# Patient Record
Sex: Female | Born: 1990 | Race: White | Hispanic: No | Marital: Single | Smoking: Current every day smoker
Health system: Southern US, Community
[De-identification: ages and names within clinical notes are randomized; demographics above are authoritative.]

## PROBLEM LIST (undated history)

## (undated) DIAGNOSIS — S32009A Unspecified fracture of unspecified lumbar vertebra, initial encounter for closed fracture: Secondary | ICD-10-CM

## (undated) DIAGNOSIS — F1190 Opioid use, unspecified, uncomplicated: Secondary | ICD-10-CM

## (undated) HISTORY — PX: CHOLECYSTECTOMY: SHX55

---
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
## 2015-05-08 ENCOUNTER — Encounter (HOSPITAL_COMMUNITY): Payer: Self-pay | Admitting: Emergency Medicine

## 2015-05-08 ENCOUNTER — Emergency Department (HOSPITAL_COMMUNITY)
Admission: EM | Admit: 2015-05-08 | Discharge: 2015-05-08 | Disposition: A | Discharge status: Home / Self Care | Payer: Self-pay

## 2015-05-08 ENCOUNTER — Emergency Department (HOSPITAL_COMMUNITY): Payer: No Typology Code available for payment source

## 2015-05-08 ENCOUNTER — Emergency Department (HOSPITAL_COMMUNITY)
Admission: EM | Admit: 2015-05-08 | Discharge: 2015-05-09 | Disposition: A | Discharge status: Home / Self Care | Payer: No Typology Code available for payment source | Attending: Emergency Medicine | Admitting: Emergency Medicine

## 2015-05-08 DIAGNOSIS — Z79899 Other long term (current) drug therapy: Secondary | ICD-10-CM | POA: Diagnosis not present

## 2015-05-08 DIAGNOSIS — R1084 Generalized abdominal pain: Secondary | ICD-10-CM

## 2015-05-08 DIAGNOSIS — K2970 Gastritis, unspecified, without bleeding: Secondary | ICD-10-CM | POA: Insufficient documentation

## 2015-05-08 DIAGNOSIS — Z3202 Encounter for pregnancy test, result negative: Secondary | ICD-10-CM | POA: Insufficient documentation

## 2015-05-08 DIAGNOSIS — Z8781 Personal history of (healed) traumatic fracture: Secondary | ICD-10-CM | POA: Diagnosis not present

## 2015-05-08 DIAGNOSIS — Z9049 Acquired absence of other specified parts of digestive tract: Secondary | ICD-10-CM | POA: Diagnosis not present

## 2015-05-08 DIAGNOSIS — R109 Unspecified abdominal pain: Secondary | ICD-10-CM | POA: Diagnosis present

## 2015-05-08 DIAGNOSIS — F17200 Nicotine dependence, unspecified, uncomplicated: Secondary | ICD-10-CM | POA: Insufficient documentation

## 2015-05-08 DIAGNOSIS — K5900 Constipation, unspecified: Secondary | ICD-10-CM | POA: Insufficient documentation

## 2015-05-08 HISTORY — DX: Unspecified fracture of unspecified lumbar vertebra, initial encounter for closed fracture: S32.009A

## 2015-05-08 LAB — COMPREHENSIVE METABOLIC PANEL
ALT: 15 U/L (ref 14–54)
AST: 15 U/L (ref 15–41)
Albumin: 4.2 g/dL (ref 3.5–5.0)
Alkaline Phosphatase: 83 U/L (ref 38–126)
Anion gap: 6 (ref 5–15)
BILIRUBIN TOTAL: 0.4 mg/dL (ref 0.3–1.2)
BUN: 18 mg/dL (ref 6–20)
CHLORIDE: 106 mmol/L (ref 101–111)
CO2: 27 mmol/L (ref 22–32)
CREATININE: 0.73 mg/dL (ref 0.44–1.00)
Calcium: 9.3 mg/dL (ref 8.9–10.3)
Glucose, Bld: 102 mg/dL — ABNORMAL HIGH (ref 65–99)
POTASSIUM: 4.1 mmol/L (ref 3.5–5.1)
Sodium: 139 mmol/L (ref 135–145)
TOTAL PROTEIN: 7.3 g/dL (ref 6.5–8.1)

## 2015-05-08 LAB — POC URINE PREG, ED: PREG TEST UR: NEGATIVE

## 2015-05-08 LAB — CBC
HEMATOCRIT: 36 % (ref 36.0–46.0)
Hemoglobin: 12.5 g/dL (ref 12.0–15.0)
MCH: 31.9 pg (ref 26.0–34.0)
MCHC: 34.7 g/dL (ref 30.0–36.0)
MCV: 91.8 fL (ref 78.0–100.0)
PLATELETS: 290 10*3/uL (ref 150–400)
RBC: 3.92 MIL/uL (ref 3.87–5.11)
RDW: 12.7 % (ref 11.5–15.5)
WBC: 10.8 10*3/uL — AB (ref 4.0–10.5)

## 2015-05-08 LAB — LIPASE, BLOOD: LIPASE: 37 U/L (ref 11–51)

## 2015-05-08 MED ORDER — DIPHENHYDRAMINE HCL 50 MG/ML IJ SOLN
25.0000 mg | Freq: Once | INTRAMUSCULAR | Status: AC
  Administered 2015-05-09: 25 mg via INTRAVENOUS
  Filled 2015-05-08: qty 1

## 2015-05-08 MED ORDER — HYDROMORPHONE HCL 1 MG/ML IJ SOLN
1.0000 mg | Freq: Once | INTRAMUSCULAR | Status: AC
Start: 2015-05-08 — End: 2015-05-09
  Administered 2015-05-09: 1 mg via INTRAVENOUS
  Filled 2015-05-08: qty 1

## 2015-05-08 MED ORDER — ONDANSETRON HCL 4 MG/2ML IJ SOLN
4.0000 mg | Freq: Once | INTRAMUSCULAR | Status: AC
  Administered 2015-05-09: 4 mg via INTRAVENOUS
  Filled 2015-05-08: qty 2

## 2015-05-08 MED ORDER — SODIUM CHLORIDE 0.9 % IV BOLUS (SEPSIS)
1000.0000 mL | Freq: Once | INTRAVENOUS | Status: AC
  Administered 2015-05-09: 1000 mL via INTRAVENOUS

## 2015-05-08 MED ORDER — METOCLOPRAMIDE HCL 5 MG/ML IJ SOLN
10.0000 mg | Freq: Once | INTRAMUSCULAR | Status: AC
  Administered 2015-05-09: 10 mg via INTRAVENOUS
  Filled 2015-05-08: qty 2

## 2015-05-08 NOTE — ED Notes (Signed)
Pt is c/o abd pain and lower back pain  Pt states she was seen by her dr today and was given phenergan and a script for zofran but she says she is still vomiting  Pt states the pain initially started for a month and a half but for the past week and a half she has had nausea and vomiting

## 2015-05-08 NOTE — ED Provider Notes (Signed)
CSN: [id number]     Arrival date & time 05/08/15  2137 History  By signing my name below, I, Anne Graham, attest that this documentation has been prepared under the direction and in the presence of Anne Creasehristopher J Aryan Bello, MD. Electronically Signed: Bethel BornBritney Graham, ED Scribe. 05/09/2015. 1:24 AM   Chief Complaint  Patient presents with  . Abdominal Pain   The history is provided by the patient. No language interpreter was used.   Anne Graham is a 25 y.o. female who presents to the Emergency Department complaining of intermittent, 10/10 in severity, right-sided abdominal pain with onset more than 1 month ago. Associated symptoms include nausea and vomiting since yesterday. She states that last week she had n/v/d but today feels constipated. She was seen by her PCP today where she was given a phenergan injection and sent home with a prescription for Zofran. She filled the prescription but did not take the medication. Pt is scheduled for an ultrasound next week but states that she cannot wait that long. Past surgical history includes cholecystectomy 1 year ago.   Past Medical History  Diagnosis Date  . Lumbar vertebral fracture [facility](HCC)    Past Surgical History  Procedure Laterality Date  . Cholecystectomy     History reviewed. No pertinent family history. Social History  Substance Use Topics  . Smoking status: Current Every Day Smoker  . Smokeless tobacco: None  . Alcohol Use: No   OB History    No data available     Review of Systems  Gastrointestinal: Positive for nausea, vomiting, abdominal pain and constipation. Negative for diarrhea.  All other systems reviewed and are negative.  Allergies  Trazodone and nefazodone  Home Medications   Prior to Admission medications   Medication Sig Start Date End Date Taking? Authorizing Provider  gabapentin (NEURONTIN) 300 MG capsule Take 300 mg by mouth 2 (two) times daily. 04/16/15  Yes Historical Provider, MD  omeprazole (PRILOSEC)  20 MG capsule Take 1 capsule (20 mg total) by mouth daily. 05/09/15   Anne Creasehristopher J Anne Schwertner, MD  ranitidine (ZANTAC) 150 MG tablet Take 1 tablet (150 mg total) by mouth 2 (two) times daily. 05/09/15   Anne Creasehristopher J Anne Van, MD  sucralfate (CARAFATE) 1 GM/10ML suspension Take 10 mLs (1 g total) by mouth 4 (four) times daily -  with meals and at bedtime. 05/09/15   Anne Creasehristopher J Anne Scripter, MD  traMADol (ULTRAM) 50 MG tablet Take 1 tablet (50 mg total) by mouth every 6 (six) hours as needed. 05/09/15   Anne Creasehristopher J Anne Karren, MD   BP 140/90 mmHg  Pulse 80  Temp(Src) 97.6 F (36.4 C) (Oral)  Resp 12  Ht 5\' 4"  (1.626 m)  Wt 123 lb (55.792 kg)  BMI 21.10 kg/m2  SpO2 100%  LMP 04/28/2015 (Approximate) Physical Exam  Constitutional: She is oriented to person, place, and time. She appears well-developed and well-nourished. No distress.  HENT:  Head: Normocephalic and atraumatic.  Right Ear: Hearing normal.  Left Ear: Hearing normal.  Nose: Nose normal.  Mouth/Throat: Oropharynx is clear and moist and mucous membranes are normal.  Eyes: Conjunctivae and EOM are normal. Pupils are equal, round, and reactive to light.  Neck: Normal range of motion. Neck supple.  Cardiovascular: Regular rhythm, S1 normal and S2 normal.  Exam reveals no gallop and no friction rub.   No murmur heard. Pulmonary/Chest: Effort normal and breath sounds normal. No respiratory distress. She exhibits no tenderness.  Abdominal: Soft. Normal appearance and bowel sounds are normal.  There is no hepatosplenomegaly. There is tenderness in the right upper quadrant, right lower quadrant and epigastric area. There is no rebound, no guarding, no tenderness at McBurney's point and negative Murphy's sign. No hernia.  Musculoskeletal: Normal range of motion.  Neurological: She is alert and oriented to person, place, and time. She has normal strength. No cranial nerve deficit or sensory deficit. Coordination normal. GCS eye subscore is 4. GCS verbal  subscore is 5. GCS motor subscore is 6.  Skin: Skin is warm, dry and intact. No rash noted. No cyanosis.  Psychiatric: She has a normal mood and affect. Her speech is normal and behavior is normal. Thought content normal.  Nursing note and vitals reviewed.   ED Course  Procedures (including critical care time) DIAGNOSTIC STUDIES: Oxygen Saturation is 100% on RA,  normal by my interpretation.    COORDINATION OF CARE: 11:11 PM Discussed treatment plan which includes lab work, Zofran, Reglan, Benadryl, and Dilaudid with pt at bedside and pt agreed to plan.  Labs Review Labs Reviewed  COMPREHENSIVE METABOLIC PANEL - Abnormal; Notable for the following:    Glucose, Bld 102 (*)    All other components within normal limits  CBC - Abnormal; Notable for the following:    WBC 10.8 (*)    All other components within normal limits  URINALYSIS, ROUTINE W REFLEX MICROSCOPIC (NOT AT [facility]) - Abnormal; Notable for the following:    APPearance CLOUDY (*)    All other components within normal limits  LIPASE, BLOOD  RPR  HIV ANTIBODY (ROUTINE TESTING)  POC URINE PREG, ED  I-STAT BETA HCG BLOOD, ED ([facility], [facility], [facility] ONLY)  GC/CHLAMYDIA PROBE AMP ([place]) NOT AT [facility]    Imaging Review No results found. I personally reviewed and evaluated these images and lab results as a part of my medical decision-making.   EKG Interpretation None      MDM   Final diagnoses:  Gastritis  Generalized abdominal pain   Patient presents to the emergency department with complaints of abdominal pain. She reports the symptoms have been ongoing for more than a month. She saw her doctor and was given symptomatic treatment, but it has not helped. Pain is diffuse. Examination reveals tenderness in the right lower, right upper, epigastric regions. There is no guarding, rebound or signs of peritonitis. Lab work was unremarkable. CT scan shows evidence of gastritis, no other acute findings. Patient will require aggressive  treatment for gastritis/GERD, referral to gastroenterology for further evaluation for possible peptic ulcer disease.  I personally performed the services described in this documentation, which was scribed in my presence. The recorded information has been reviewed and is accurate.    Anne Crease, MD 05/12/15 [phone]

## 2015-05-09 LAB — URINALYSIS, ROUTINE W REFLEX MICROSCOPIC
Bilirubin Urine: NEGATIVE
Glucose, UA: NEGATIVE mg/dL
Hgb urine dipstick: NEGATIVE
KETONES UR: NEGATIVE mg/dL
LEUKOCYTES UA: NEGATIVE
NITRITE: NEGATIVE
PROTEIN: NEGATIVE mg/dL
Specific Gravity, Urine: 1.026 (ref 1.005–1.030)
pH: 7.5 (ref 5.0–8.0)

## 2015-05-09 LAB — I-STAT BETA HCG BLOOD, ED (MC, WL, AP ONLY)

## 2015-05-09 MED ORDER — IOPAMIDOL (ISOVUE-300) INJECTION 61%
100.0000 mL | Freq: Once | INTRAVENOUS | Status: AC | PRN
  Administered 2015-05-09: 100 mL via INTRAVENOUS

## 2015-05-09 MED ORDER — OMEPRAZOLE 20 MG PO CPDR: 20.0000 mg | DELAYED_RELEASE_CAPSULE | Freq: Every day | ORAL | Status: AC

## 2015-05-09 MED ORDER — RANITIDINE HCL 150 MG PO TABS: 150.0000 mg | ORAL_TABLET | Freq: Two times a day (BID) | ORAL | Status: AC

## 2015-05-09 MED ORDER — TRAMADOL HCL 50 MG PO TABS: 50.0000 mg | ORAL_TABLET | Freq: Four times a day (QID) | ORAL | Status: AC | PRN

## 2015-05-09 MED ORDER — GI COCKTAIL ~~LOC~~
30.0000 mL | Freq: Once | ORAL | Status: AC
  Administered 2015-05-09: 30 mL via ORAL
  Filled 2015-05-09: qty 30

## 2015-05-09 MED ORDER — SUCRALFATE 1 GM/10ML PO SUSP: 1.0000 g | Freq: Three times a day (TID) | ORAL | Status: AC

## 2015-05-09 NOTE — Discharge Instructions (Signed)

## 2015-05-09 NOTE — ED Notes (Signed)
Pt reports understanding of discharge information. No questions at time of discharge 

## 2015-05-10 LAB — HIV ANTIBODY (ROUTINE TESTING W REFLEX): HIV SCREEN 4TH GENERATION: NONREACTIVE

## 2015-05-10 LAB — RPR: RPR: NONREACTIVE

## 2017-07-27 DIAGNOSIS — J189 Pneumonia, unspecified organism: Secondary | ICD-10-CM | POA: Diagnosis not present

## 2017-07-28 DIAGNOSIS — J189 Pneumonia, unspecified organism: Secondary | ICD-10-CM | POA: Diagnosis not present

## 2017-09-17 ENCOUNTER — Other Ambulatory Visit: Payer: Self-pay

## 2017-09-17 ENCOUNTER — Encounter (HOSPITAL_COMMUNITY): Payer: Self-pay | Admitting: Family Medicine

## 2017-09-17 ENCOUNTER — Inpatient Hospital Stay (HOSPITAL_COMMUNITY)
Admission: AD | Admit: 2017-09-17 | Discharge: 2017-09-17 | DRG: 202 | Discharge status: Against Medical Advice | Payer: Medicaid Other | Source: Other Acute Inpatient Hospital | Attending: Internal Medicine | Admitting: Internal Medicine

## 2017-09-17 ENCOUNTER — Inpatient Hospital Stay (HOSPITAL_COMMUNITY): Payer: Medicaid Other

## 2017-09-17 DIAGNOSIS — J42 Unspecified chronic bronchitis: Secondary | ICD-10-CM | POA: Diagnosis present

## 2017-09-17 DIAGNOSIS — Z833 Family history of diabetes mellitus: Secondary | ICD-10-CM | POA: Diagnosis not present

## 2017-09-17 DIAGNOSIS — Z885 Allergy status to narcotic agent status: Secondary | ICD-10-CM | POA: Diagnosis not present

## 2017-09-17 DIAGNOSIS — F17210 Nicotine dependence, cigarettes, uncomplicated: Secondary | ICD-10-CM | POA: Diagnosis present

## 2017-09-17 DIAGNOSIS — E871 Hypo-osmolality and hyponatremia: Secondary | ICD-10-CM | POA: Diagnosis present

## 2017-09-17 DIAGNOSIS — R05 Cough: Secondary | ICD-10-CM

## 2017-09-17 DIAGNOSIS — J209 Acute bronchitis, unspecified: Principal | ICD-10-CM | POA: Diagnosis present

## 2017-09-17 DIAGNOSIS — J9601 Acute respiratory failure with hypoxia: Secondary | ICD-10-CM | POA: Diagnosis present

## 2017-09-17 DIAGNOSIS — R053 Chronic cough: Secondary | ICD-10-CM

## 2017-09-17 DIAGNOSIS — Z9049 Acquired absence of other specified parts of digestive tract: Secondary | ICD-10-CM | POA: Diagnosis not present

## 2017-09-17 DIAGNOSIS — R0602 Shortness of breath: Secondary | ICD-10-CM | POA: Diagnosis present

## 2017-09-17 LAB — CBC WITH DIFFERENTIAL/PLATELET
Abs Immature Granulocytes: 0 10*3/uL (ref 0.0–0.1)
Basophils Absolute: 0 10*3/uL (ref 0.0–0.1)
Basophils Relative: 0 %
EOS ABS: 0 10*3/uL (ref 0.0–0.7)
Eosinophils Relative: 0 %
HEMATOCRIT: 37.3 % (ref 36.0–46.0)
Hemoglobin: 12.1 g/dL (ref 12.0–15.0)
IMMATURE GRANULOCYTES: 0 %
LYMPHS ABS: 0.6 10*3/uL — AB (ref 0.7–4.0)
Lymphocytes Relative: 5 %
MCH: 29.4 pg (ref 26.0–34.0)
MCHC: 32.4 g/dL (ref 30.0–36.0)
MCV: 90.5 fL (ref 78.0–100.0)
MONO ABS: 0.1 10*3/uL (ref 0.1–1.0)
MONOS PCT: 1 %
NEUTROS PCT: 94 %
Neutro Abs: 11 10*3/uL — ABNORMAL HIGH (ref 1.7–7.7)
Platelets: 355 10*3/uL (ref 150–400)
RBC: 4.12 MIL/uL (ref 3.87–5.11)
RDW: 14.7 % (ref 11.5–15.5)
WBC: 11.9 10*3/uL — ABNORMAL HIGH (ref 4.0–10.5)

## 2017-09-17 LAB — COMPREHENSIVE METABOLIC PANEL
ALBUMIN: 3.4 g/dL — AB (ref 3.5–5.0)
ALK PHOS: 92 U/L (ref 38–126)
ALT: 15 U/L (ref 0–44)
AST: 19 U/L (ref 15–41)
Anion gap: 10 (ref 5–15)
BUN: 7 mg/dL (ref 6–20)
CALCIUM: 9.7 mg/dL (ref 8.9–10.3)
CHLORIDE: 102 mmol/L (ref 98–111)
CO2: 27 mmol/L (ref 22–32)
CREATININE: 0.65 mg/dL (ref 0.44–1.00)
GFR calc non Af Amer: >60 mL/min (ref >60)
GLUCOSE: 176 mg/dL — AB (ref 70–99)
Potassium: 3.8 mmol/L (ref 3.5–5.1)
SODIUM: 139 mmol/L (ref 135–145)
Total Bilirubin: 1.1 mg/dL (ref 0.3–1.2)
Total Protein: 7.1 g/dL (ref 6.5–8.1)

## 2017-09-17 LAB — HIV ANTIBODY (ROUTINE TESTING W REFLEX): HIV SCREEN 4TH GENERATION: NONREACTIVE

## 2017-09-17 LAB — PROCALCITONIN: Procalcitonin: <0.1 ng/mL

## 2017-09-17 MED ORDER — ALBUTEROL SULFATE (2.5 MG/3ML) 0.083% IN NEBU
2.5000 mg | INHALATION_SOLUTION | RESPIRATORY_TRACT | Status: DC | PRN
Start: 2017-09-17 — End: 2017-09-17

## 2017-09-17 MED ORDER — TRAMADOL HCL 50 MG PO TABS: 50.0000 mg | ORAL_TABLET | Freq: Four times a day (QID) | ORAL | Status: DC | PRN

## 2017-09-17 MED ORDER — SODIUM CHLORIDE 0.9 % IV SOLN: 250.0000 mL | INTRAVENOUS | Status: DC | PRN

## 2017-09-17 MED ORDER — ONDANSETRON HCL 4 MG PO TABS: 4.0000 mg | ORAL_TABLET | Freq: Four times a day (QID) | ORAL | Status: DC | PRN

## 2017-09-17 MED ORDER — SODIUM CHLORIDE 0.9% FLUSH: 3.0000 mL | INTRAVENOUS | Status: DC | PRN

## 2017-09-17 MED ORDER — PANTOPRAZOLE SODIUM 40 MG PO TBEC
40.0000 mg | DELAYED_RELEASE_TABLET | Freq: Every day | ORAL | Status: DC
  Administered 2017-09-17: 40 mg via ORAL
  Filled 2017-09-17: qty 1

## 2017-09-17 MED ORDER — ACETAMINOPHEN 325 MG PO TABS: 650.0000 mg | ORAL_TABLET | Freq: Four times a day (QID) | ORAL | Status: DC | PRN

## 2017-09-17 MED ORDER — SODIUM CHLORIDE 0.9% FLUSH
3.0000 mL | Freq: Two times a day (BID) | INTRAVENOUS | Status: DC
  Administered 2017-09-17: 3 mL via INTRAVENOUS

## 2017-09-17 MED ORDER — SENNOSIDES-DOCUSATE SODIUM 8.6-50 MG PO TABS: 1.0000 | ORAL_TABLET | Freq: Every evening | ORAL | Status: DC | PRN

## 2017-09-17 MED ORDER — ACETAMINOPHEN 650 MG RE SUPP: 650.0000 mg | Freq: Four times a day (QID) | RECTAL | Status: DC | PRN

## 2017-09-17 MED ORDER — HYDROCODONE-ACETAMINOPHEN 5-325 MG PO TABS: 1.0000 | ORAL_TABLET | ORAL | Status: DC | PRN

## 2017-09-17 MED ORDER — ONDANSETRON HCL 4 MG/2ML IJ SOLN: 4.0000 mg | Freq: Four times a day (QID) | INTRAMUSCULAR | Status: DC | PRN

## 2017-09-17 NOTE — Discharge Summary (Signed)
Physician Discharge Summary  Patient ID: Anne Graham MRN: [id number] DOB/AGE: 06/04/1990 27 y.o.  Admit date: 09/17/2017 Discharge date: 09/17/2017  Admission Diagnoses:  Discharge Diagnoses:  Principal Problem:   Acute bronchitis with bronchospasm Active Problems:   Acute respiratory failure with hypoxia [facility](HCC)   Chronic cough   Hospital Course: Patient was admitted earlier today.  Kindly refer to the H&P done earlier by Dr. Marcial Pacasimothy Graham.  Patient signed himself out of the hospital AGAINST MEDICAL ADVICE (same day of admission).  Allergies as of 09/17/2017      Reactions   Toradol [ketorolac Tromethamine]    Difficulty swallowing and feels metal test in her mouth for long times.      Medication List    ASK your doctor about these medications   albuterol (2.5 MG/3ML) 0.083% nebulizer solution Commonly known as:  PROVENTIL Take 2.5 mg by nebulization every 6 (six) hours as needed for wheezing or shortness of breath.   COMBIVENT RESPIMAT 20-100 MCG/ACT Aers respimat Generic drug:  Ipratropium-Albuterol Inhale 1 puff into the lungs every 6 (six) hours.   omeprazole 20 MG capsule Commonly known as:  PRILOSEC Take 1 capsule (20 mg total) by mouth daily.   ranitidine 150 MG tablet Commonly known as:  ZANTAC Take 1 tablet (150 mg total) by mouth 2 (two) times daily.   sucralfate 1 GM/10ML suspension Commonly known as:  CARAFATE Take 10 mLs (1 g total) by mouth 4 (four) times daily -  with meals and at bedtime.   traMADol 50 MG tablet Commonly known as:  ULTRAM Take 1 tablet (50 mg total) by mouth every 6 (six) hours as needed.        SignedBarnetta Graham: Anne Graham I Anne Graham 09/17/2017, 11:12 PM

## 2017-09-17 NOTE — Progress Notes (Signed)
Patient very agitated w/mother at bedside, requests immediate D/C. MD notified via txt page and called this RN to speak with patient via telephone. Patient to leave AMA at this time.

## 2017-09-17 NOTE — Consult Note (Signed)
Anne Graham  HYW:[id number] DOB: 01-28-1991 DOA: 09/17/2017 PCP: System, Pcp Not In    Reason for Consult/Chief Complaint:  Recurrent pneumonia  Consulting MD:  Marthenia Rolling   HPI/Brief Narrative   27 year old female patient with a history of smoking, initially admitted at [facility] via referral from [facility] back in June/July for recurrent multifocal pulmonary infiltrates.  Review of medical records show last hospital discharge on 7/4 2019 when the patient left AGAINST MEDICAL ADVICE.  During that admission she had been admitted following approximately 3 weeks of ongoing cough and shortness of breath with failure to respond to outpatient antibiotics including doxycycline, Zithromax, and Cefdinir.  CT of chest on that admission showed multifocal bilateral pulmonary infiltrates.  She was started on IV antibiotics, with plans to undergo further diagnostics such as autoimmune evaluation, checking immunocompromised status and ruling out opportunistic infections as well as possible bronchoscopy.  The patient stayed in the hospital only 1 day and discharged herself reporting she wanted to be home with her family for 4 July. She presented once again to the emergency room on 8/15.  At time of discharge she still had cough and shortness of breath.  The symptoms had not completely resolved and she had returned to the emergency room at [facility] on at least 2 different occasions and treated for bronchospasm with steroids.  On each occasion with steroid therapy her symptoms completely resolved, and then reoccurred within 24 hours of stopping.  On presentation in the emergency room she was found to be afebrile, room air oxygen saturations at 81%. And Ceftinar.,  She was tachypneic and tachycardic.  Her white blood cell count was 15.1, sed rate 45, CRP 66.  She was wheezing on presentation.  Chest x-ray showed some possible right perihilar infiltrate, however this was certainly less  prominent than imaging on 7/3.  She was transferred to [facility] for further diagnostic and therapeutic evaluation and specifically pulmonary consultation.  Assessment & Plan:  Recurrent multifocal pulmonary infiltrates.  Seemingly steroid responsive per history.  Differential diagnosis is wide including pneumonia, recurrent aspiration in setting of poor dentition, viral process, autoimmune pneumonitis, or potentially pneumonitis from environmental factors.  -Less likely inclined to think this is infectious in etiology given the only thing that seems to improve her symptoms is systemic steroids Plan We will send extensive serologies for autoimmune process hypersensitivity pneumonitis panel Check sputum culture Check respiratory viral panel Check urine strep antigen Repeat CT chest with high-resolution cuts We will consider bronchoscopy May need to consider open lung biopsy   Best practice/Goals of care/disposition.   DVT prophylaxis: SCD GI prophylaxis: Not applicable Diet: Regular Mobility: Out of bed Code Status: Full code Family Communication: Pending  Disposition/ summary of today's plan:    Sending multiple autoimmune serologies, as well as HSP and getting high resolution CT chest.  Will see Ramaswamy.   Consultants:  Pulmonary 8/16  Procedures:   Significant diagnostic tests: CT chest without contrast 8/16: >>> Procalcitonin 8/16: Less than 0.1   SEROLOGIES:  ANA >>> MPO /PR-3 >>> ANCA >>> C3 >>> C4 >>> SMITH AB >>> DS DNA AB >>> HISTONE AB >>> CENTROMERE AB >>> SSA >>> SSB >>> ANTI-GBM >>> HYPERSENSITIVITY PNEUMONITIS PANEL >>> ESR >>>45 CRP >>>66 SCL-70 >>> RF >>>   Micro data: Sputum culture 8/16>>> Urine strep 8/16>>> HIV antibody 8/16 Respiratory Panel PCR >>>  Antimicrobials:    Subjective  Short of breath, has occasional chest discomfort with cough  Objective    Blood pressure (Abnormal) 91/45, pulse 67, temperature 97.7  F (36.5 C), temperature source Oral, resp. rate 18, height 5' 3"  (1.6 m), weight 52.8 kg, SpO2 98 %.       No intake or output data in the 24 hours ending 09/17/17 0837 Filed Weights   09/17/17 0104  Weight: 52.8 kg    Examination: General: 27 year old white female pale in appearance, no acute distress HENT: Normocephalic atraumatic mucous membranes are little dry.  She has poor dentition Lungs: Faint basilar rales.  Occasional expiratory wheeze Cardiovascular: Regular rate and rhythm without murmur rub or gallop Abdomen: Soft, nontender no organomegaly Extremities: Equal strength and bulk.  No rashes Neuro: Oriented no focal deficits GU: Voids  Labs   CBC: Recent Labs  Lab 09/17/17 0506  WBC 11.9*  NEUTROABS 11.0*  HGB 12.1  HCT 37.3  MCV 90.5  PLT 858   Basic Metabolic Panel: Recent Labs  Lab 09/17/17 0506  NA 139  K 3.8  CL 102  CO2 27  GLUCOSE 176*  BUN 7  CREATININE 0.65  CALCIUM 9.7   GFR: Estimated Creatinine Clearance: 87.4 mL/min (by C-G formula based on SCr of 0.65 mg/dL). Recent Labs  Lab 09/17/17 0506  PROCALCITON <0.10  WBC 11.9*   Liver Function Tests: Recent Labs  Lab 09/17/17 0506  AST 19  ALT 15  ALKPHOS 92  BILITOT 1.1  PROT 7.1  ALBUMIN 3.4*   No results for input(s): LIPASE, AMYLASE in the last 168 hours. No results for input(s): AMMONIA in the last 168 hours. ABG No results found for: PHART, PCO2ART, PO2ART, HCO3, TCO2, ACIDBASEDEF, O2SAT  Coagulation Profile: No results for input(s): INR, PROTIME in the last 168 hours. Cardiac Enzymes: No results for input(s): CKTOTAL, CKMB, CKMBINDEX, TROPONINI in the last 168 hours. HbA1C: No results found for: HGBA1C CBG: No results for input(s): GLUCAP in the last 168 hours.   Review of Systems:   General: Denies sick exposure, generalized weakness, or fatigue.  HEENT: No headache, denies congestion.  Voice is hoarse but she thinks this is more from not drinking.  No sinus  congestion, no sore throat.  Dentition is poor, she recently broke her tooth eating a sandwich.  No focal pain however.  Pulmonary: Occasional cough, productive at times with yellow mucus.  She has shortness of breath with exertion.  She still smokes.  She lives in a [place] trailer.  There are 2 carpets in the house, they are in good shape, and not in room she is in.  She thinks the air conditioner is in good shape.  She denies any exposures to dusts, or molds.  They have no birds.  No exposures to mice or roaches.  No standing water.  Denies vaping, or smoking illegal drugs.  Interesting onset of symptoms initially started during her pregnancy cardiac: No chest pain or palpitations abdomen: Appetite decreased.  Moved on p.o. prednisone.  Denies nausea vomiting diarrhea GU: No dysuria hesitancy or frequency neuro: No gait disturbance memory loss no focal weakness musculoskeletal: No joint pain, or limited range of motion no swelling.  Dermatology: No rashes   Past medical history   Past Medical History:  Diagnosis Date  . Lumbar vertebral fracture [facility])    Social History   Social History   . Marital status:  . Number of children:  .   Lives with her mother.  They live in a trailer.  She is lived there all of her life.   Marland Kitchen  Smoking status: 1/2 pack a day since age of 70     . Alcohol use: Did not inquire  . Drug use: Denies vaping, denies cocaine use or illegal drug use  . Sexual activity: Did not require  .  Marland Kitchen Stress: Not inquire  .  Other Topics  .  Marland Kitchen    Family history    Family History  Problem Relation Age of Onset  . Diabetes Father   She thinks her great on on her father's side had lung and arthritis problems. Allergies Allergies  Allergen Reactions  . Toradol [Ketorolac Tromethamine]     Difficulty swallowing and feels metal test in her mouth for long times.    Home meds  Prior to Admission medications   Medication Sig Start Date End Date Taking? Authorizing Provider   gabapentin (NEURONTIN) 300 MG capsule Take 300 mg by mouth 2 (two) times daily. 04/16/15   [provider]  omeprazole (PRILOSEC) 20 MG capsule Take 1 capsule (20 mg total) by mouth daily. 05/09/15   Orpah Greek, MD  ranitidine (ZANTAC) 150 MG tablet Take 1 tablet (150 mg total) by mouth 2 (two) times daily. 05/09/15   Orpah Greek, MD  sucralfate (CARAFATE) 1 GM/10ML suspension Take 10 mLs (1 g total) by mouth 4 (four) times daily -  with meals and at bedtime. 05/09/15   Orpah Greek, MD  traMADol (ULTRAM) 50 MG tablet Take 1 tablet (50 mg total) by mouth every 6 (six) hours as needed. 05/09/15   Orpah Greek, MD     LOS: 0 days

## 2017-09-17 NOTE — H&P (Signed)
History and Physical    Anne Graham UMP:[id number] DOB: Apr 25, 1990 DOA: 09/17/2017  PCP: System, Pcp Not In   Patient coming from: Home, by way of [facility]   Chief Complaint: SOB, cough, wheezing   HPI: Anne Graham is a 27 y.o. female with medical history significant for tobacco abuse, now presenting to the emergency department for evaluation of cough, shortness of breath, and wheezing.  Patient was admitted to the hospital in July 2019 after failing outpatient treatment of pneumonia.  She had a chest CT prior to the admission, and again during, suggestive of multifocal pneumonia, but with apparent migratory infiltrates that raised concern for possible Goodpasture's, Churg-Strauss, eosinophilic pneumonia, or COP.  She began to improve during the hospitalization, but left AMA, continued to have cough and dyspnea at home.  She reports that the cough, shortness of breath, and wheezing never resolved and has worsened in the past several days, leading to her current presentation in the ED. She denies any rash, denies hematuria or flank pain, denies chest pain, lower extremity swelling, or lower extremity tenderness.  She reports that she has had several courses of prednisone since the initial onset of the symptoms, reports improving each time, but worsening again after completing the course.  [facility] ED Course: Upon arrival to the ED, patient is found to be afebrile, saturating 81% on room air, slightly tachypneic and tachycardic, and with stable blood pressure.  EKG features a sinus tachycardia with rate 135 and pulmonary disease pattern per report.  Chest x-ray was negative for acute cardiopulmonary disease.  Chemistry panel was notable for a slight hyponatremia and preserved renal function.  CBC is notable for leukocytosis to 15,100.  ESR is 45 and CRP 66.  Lactic acid was normal, INR normal, troponin undetectable, and BNP normal.  Patient was treated with albuterol, DuoNeb, 1 L of  normal saline, 125 mg of IV Solu-Medrol, and Zosyn in the ED.  She has remained stable on 4 L/min of supplemental oxygen.  Pulmonology was consulted by the ED physician and recommended medical admission to [facility] where they will evaluate the patient in consultation.  Review of Systems:  All other systems reviewed and apart from HPI, are negative.  Past Medical History:  Diagnosis Date  . Lumbar vertebral fracture [facility])     Past Surgical History:  Procedure Laterality Date  . CHOLECYSTECTOMY       reports that she has been smoking. She has been smoking about 1.00 pack per day. She has never used smokeless tobacco. She reports that she does not drink alcohol or use drugs.  Allergies  Allergen Reactions  . Toradol [Ketorolac Tromethamine]     Difficulty swallowing and feels metal test in her mouth for long times.    Family History  Problem Relation Age of Onset  . Diabetes Father      Prior to Admission medications   Medication Sig Start Date End Date Taking? Authorizing Provider  gabapentin (NEURONTIN) 300 MG capsule Take 300 mg by mouth 2 (two) times daily. 04/16/15   [provider]  omeprazole (PRILOSEC) 20 MG capsule Take 1 capsule (20 mg total) by mouth daily. 05/09/15   Orpah Greek, MD  ranitidine (ZANTAC) 150 MG tablet Take 1 tablet (150 mg total) by mouth 2 (two) times daily. 05/09/15   Orpah Greek, MD  sucralfate (CARAFATE) 1 GM/10ML suspension Take 10 mLs (1 g total) by mouth 4 (four) times daily -  with meals and at  bedtime. 05/09/15   Orpah Greek, MD  traMADol (ULTRAM) 50 MG tablet Take 1 tablet (50 mg total) by mouth every 6 (six) hours as needed. 05/09/15   Orpah Greek, MD    Physical Exam: Vitals:   09/17/17 0104  BP: (!) 104/58  Pulse: 94  Resp: 18  Temp: 98.3 F (36.8 C)  TempSrc: Oral  SpO2: 97%  Weight: 52.8 kg  Height: 5' 3"  (1.6 m)      Constitutional: NAD, calm  Eyes: PERTLA, lids and  conjunctivae normal ENMT: Mucous membranes are moist. Posterior pharynx clear of any exudate or lesions.   Neck: normal, supple, no masses, no thyromegaly Respiratory: Speaking full sentences, respirations unlabored. Expiratory wheezes. No accessory muscle use.  Cardiovascular: S1 & S2 heard, regular rate and rhythm. No extremity edema.  Abdomen: No distension, no tenderness, soft. Bowel sounds active.  Musculoskeletal: no clubbing / cyanosis. No joint deformity upper and lower extremities.   Skin: no significant rashes, lesions, ulcers. Warm, dry, well-perfused. Neurologic: CN 2-12 grossly intact. Sensation intact. Strength 5/5 in all 4 limbs.  Psychiatric: Alert and oriented x 3. Calm, cooperative.     Labs on Admission: I have personally reviewed following labs and imaging studies  CBC: No results for input(s): WBC, NEUTROABS, HGB, HCT, MCV, PLT in the last 168 hours. Basic Metabolic Panel: No results for input(s): NA, K, CL, CO2, GLUCOSE, BUN, CREATININE, CALCIUM, MG, PHOS in the last 168 hours. GFR: CrCl cannot be calculated (Patient's most recent lab result is older than the maximum 21 days allowed.). Liver Function Tests: No results for input(s): AST, ALT, ALKPHOS, BILITOT, PROT, ALBUMIN in the last 168 hours. No results for input(s): LIPASE, AMYLASE in the last 168 hours. No results for input(s): AMMONIA in the last 168 hours. Coagulation Profile: No results for input(s): INR, PROTIME in the last 168 hours. Cardiac Enzymes: No results for input(s): CKTOTAL, CKMB, CKMBINDEX, TROPONINI in the last 168 hours. BNP (last 3 results) No results for input(s): PROBNP in the last 8760 hours. HbA1C: No results for input(s): HGBA1C in the last 72 hours. CBG: No results for input(s): GLUCAP in the last 168 hours. Lipid Profile: No results for input(s): CHOL, HDL, LDLCALC, TRIG, CHOLHDL, LDLDIRECT in the last 72 hours. Thyroid Function Tests: No results for input(s): TSH, T4TOTAL,  FREET4, T3FREE, THYROIDAB in the last 72 hours. Anemia Panel: No results for input(s): VITAMINB12, FOLATE, FERRITIN, TIBC, IRON, RETICCTPCT in the last 72 hours. Urine analysis:    Component Value Date/Time   COLORURINE YELLOW 05/08/2015 2340   APPEARANCEUR CLOUDY (A) 05/08/2015 2340   LABSPEC 1.026 05/08/2015 2340   PHURINE 7.5 05/08/2015 2340   GLUCOSEU NEGATIVE 05/08/2015 2340   HGBUR NEGATIVE 05/08/2015 2340   BILIRUBINUR NEGATIVE 05/08/2015 2340   KETONESUR NEGATIVE 05/08/2015 2340   PROTEINUR NEGATIVE 05/08/2015 2340   NITRITE NEGATIVE 05/08/2015 2340   LEUKOCYTESUR NEGATIVE 05/08/2015 2340   Sepsis Labs: @LABRCNTIP (procalcitonin:4,lacticidven:4) )No results found for this or any previous visit (from the past 240 hour(s)).   Radiological Exams on Admission: No results found.  EKG: Sinus tachycardia (rate 136), RAD.   Assessment/Plan   1. Bronchospasm; acute hypoxic respiratory failure  - Patient has had SOB, wheezing, and productive cough for months, reports improving with prednisone, but worsens again after completion   - Chest CT from 08/05/17 was consistent with multifocal pneumonia, but compared to a prior CT, migratory infiltrates raised concern for Goodpasture, Churg-Strauss, eosinophilic PNA, COP, or PCP  - She now  presents with acute worsening in symptoms, has new 4 Lpm supplemental O2 requirement, ESR 45, CRP 66, and CXR without acute findings  - She was treated with 125 mg IV Solu-Medrol, 1 L NS, nebs, and Zosyn in the ED  - Pulmonology is consulting and much appreciated  - Continue supplemental O2 as needed, continue prn bronchodilators, check ANCA, HIV, IgE, urinalysis, anti-GBM    2. Current smoker  - 1 ppd, not ready to quit, declined nicotine patch    DVT prophylaxis: SCD's  Code Status: Full  Family Communication: Discussed with patient  Consults called: Pulmonology  Admission status: Inpatient     Vianne Bulls, MD [facility] Pager  [phone]  If 7PM-7AM, please contact night-coverage [email or web address] Password [facility]  09/17/2017, 2:36 AM

## 2017-09-17 NOTE — Progress Notes (Signed)
Patient arrived in the floor at 0055 am in a stretcher escorted  By EMS, pt is alert and oriented x 4, resting in a bed, no s/s of distress, is on O2 4L nasal canula, paged Oncall   Physician, and waiting for new orders.

## 2017-09-18 LAB — C4 COMPLEMENT: COMPLEMENT C4, BODY FLUID: 36 mg/dL (ref 14–44)

## 2017-09-18 LAB — CENTROMERE ANTIBODIES: Centromere Ab Screen: <0.2 AI (ref 0.0–0.9)

## 2017-09-18 LAB — C3 COMPLEMENT: C3 COMPLEMENT: 186 mg/dL — AB (ref 82–167)

## 2017-09-18 LAB — ANA: Anti Nuclear Antibody(ANA): NEGATIVE

## 2017-09-18 LAB — SJOGRENS SYNDROME-A EXTRACTABLE NUCLEAR ANTIBODY

## 2017-09-18 LAB — RHEUMATOID FACTOR: Rhuematoid fact SerPl-aCnc: 13.9 IU/mL (ref 0.0–13.9)

## 2017-09-18 LAB — ANTI-DNA ANTIBODY, DOUBLE-STRANDED: ds DNA Ab: 2 IU/mL (ref 0–9)

## 2017-09-18 LAB — SJOGRENS SYNDROME-B EXTRACTABLE NUCLEAR ANTIBODY: SSB (La) (ENA) Antibody, IgG: <0.2 AI (ref 0.0–0.9)

## 2017-09-19 LAB — CYCLIC CITRUL PEPTIDE ANTIBODY, IGG/IGA: CCP Antibodies IgG/IgA: 9 units (ref 0–19)

## 2017-09-19 LAB — MPO/PR-3 (ANCA) ANTIBODIES
ANCA Proteinase 3: <3.5 U/mL (ref 0.0–3.5)
Myeloperoxidase Abs: <9 U/mL (ref 0.0–9.0)

## 2017-09-20 LAB — GLOMERULAR BASEMENT MEMBRANE ANTIBODIES: GBM Ab: 4 units (ref 0–20)

## 2017-09-21 LAB — IGE: IgE (Immunoglobulin E), Serum: 399 IU/mL (ref 6–495)

## 2017-09-21 LAB — HISTONE ANTIBODIES, IGG, BLOOD: DNA-Histone: 0.3 Units (ref 0.0–0.9)

## 2017-09-22 LAB — ANCA TITERS: C-ANCA: <1:20 {titer}

## 2017-09-22 LAB — HYPERSENSITIVITY PNEUMONITIS
A. PULLULANS ABS: NEGATIVE
A.Fumigatus #1 Abs: NEGATIVE
MICROPOLYSPORA FAENI IGG: NEGATIVE
Pigeon Serum Abs: NEGATIVE
THERMOACTINOMYCES VULGARIS IGG: NEGATIVE
Thermoact. Saccharii: NEGATIVE

## 2019-02-06 ENCOUNTER — Ambulatory Visit (HOSPITAL_COMMUNITY)
Admission: RE | Admit: 2019-02-06 | Discharge: 2019-02-06 | Disposition: A | Discharge status: Home / Self Care | Payer: Medicaid Other | Attending: Psychiatry | Admitting: Psychiatry

## 2019-02-06 ENCOUNTER — Emergency Department (HOSPITAL_COMMUNITY)
Admission: EM | Admit: 2019-02-06 | Discharge: 2019-02-06 | Disposition: A | Discharge status: Home / Self Care | Payer: Medicaid Other | Attending: Emergency Medicine | Admitting: Emergency Medicine

## 2019-02-06 ENCOUNTER — Encounter (HOSPITAL_COMMUNITY): Payer: Self-pay

## 2019-02-06 ENCOUNTER — Other Ambulatory Visit: Payer: Self-pay

## 2019-02-06 DIAGNOSIS — Z1389 Encounter for screening for other disorder: Secondary | ICD-10-CM | POA: Diagnosis not present

## 2019-02-06 DIAGNOSIS — F17210 Nicotine dependence, cigarettes, uncomplicated: Secondary | ICD-10-CM | POA: Diagnosis not present

## 2019-02-06 DIAGNOSIS — F1120 Opioid dependence, uncomplicated: Secondary | ICD-10-CM | POA: Diagnosis not present

## 2019-02-06 DIAGNOSIS — F1110 Opioid abuse, uncomplicated: Secondary | ICD-10-CM | POA: Insufficient documentation

## 2019-02-06 HISTORY — DX: Opioid use, unspecified, uncomplicated: F11.90

## 2019-02-06 NOTE — ED Triage Notes (Signed)
Received call from [facility] stating pt has been seen 3 times back to back and given outpatient resources for ETOH. As she was leaving for the third time, she told them she was going across the street and tell them she is crazy so she can get rehab. Pt has arrived in ED.

## 2019-02-06 NOTE — ED Triage Notes (Addendum)
Patient states she wants rehab for her heroin use. Patient states she used 2 days ago. Patient denies any other drug or alcohol use.  Patient denies SI/HI.  Patient is very sleepy in triage.

## 2019-02-06 NOTE — ED Provider Notes (Addendum)
[facility]-EMERGENCY DEPT Provider Note   CSN: [id number] Arrival date & time: 02/06/19  1504     History No chief complaint on file.   Anne Graham is a 29 y.o. female.  HPI      Anne Graham is a 29 y.o. female, with a history of heroin use, presenting to the ED requesting inpatient rehabilitation for substance abuse. Was seen and assessed by psych this morning and cleared by them. Patient states she does not think outpatient services would be appropriate for her.  She has admitted previously that she has not really looked into them, she just wants to be "somewhere away from everything." She uses heroin every day or every other day.  Denies SI/HI for me.    Currently denies any fever/chills, chest pain, shortness of breath, cough, abdominal pain, diaphoresis, N/V/D, or any other complaints.    Past Medical History:  Diagnosis Date  . Heroin use   . Lumbar vertebral fracture [facility])     Patient Active Problem List   Diagnosis Date Noted  . Acute bronchitis with bronchospasm 09/17/2017  . Acute respiratory failure with hypoxia (HCC) 09/17/2017  . Chronic cough     Past Surgical History:  Procedure Laterality Date  . CHOLECYSTECTOMY       OB History   No obstetric history on file.     Family History  Problem Relation Age of Onset  . Diabetes Father     Social History   Tobacco Use  . Smoking status: Current Every Day Smoker    Packs/day: 1.00  . Smokeless tobacco: Never Used  Substance Use Topics  . Alcohol use: No  . Drug use: Yes    Comment: heroin    Home Medications Prior to Admission medications   Medication Sig Start Date End Date Taking? Authorizing Provider  albuterol (PROVENTIL) (2.5 MG/3ML) 0.083% nebulizer solution Take 2.5 mg by nebulization every 6 (six) hours as needed for wheezing or shortness of breath.    [provider]  Ipratropium-Albuterol (COMBIVENT RESPIMAT) 20-100 MCG/ACT AERS respimat Inhale 1  puff into the lungs every 6 (six) hours.    [provider]  omeprazole (PRILOSEC) 20 MG capsule Take 1 capsule (20 mg total) by mouth daily. Patient not taking: Reported on 09/17/2017 05/09/15   Gilda Crease, MD  ranitidine (ZANTAC) 150 MG tablet Take 1 tablet (150 mg total) by mouth 2 (two) times daily. Patient not taking: Reported on 09/17/2017 05/09/15   Gilda Crease, MD  sucralfate (CARAFATE) 1 GM/10ML suspension Take 10 mLs (1 g total) by mouth 4 (four) times daily -  with meals and at bedtime. Patient not taking: Reported on 09/17/2017 05/09/15   Gilda Crease, MD  traMADol (ULTRAM) 50 MG tablet Take 1 tablet (50 mg total) by mouth every 6 (six) hours as needed. Patient not taking: Reported on 09/17/2017 05/09/15   Gilda Crease, MD    Allergies    Toradol [ketorolac tromethamine]  Review of Systems   Review of Systems  Constitutional: Negative for chills, diaphoresis and fever.  Respiratory: Negative for cough and shortness of breath.   Cardiovascular: Negative for chest pain.  Gastrointestinal: Negative for abdominal pain, diarrhea, nausea and vomiting.  Neurological: Negative for syncope, weakness and numbness.  Psychiatric/Behavioral:       Requesting inpatient substance abuse rehabilitation.  All other systems reviewed and are negative.   Physical Exam Updated Vital Signs BP 110/65 (BP Location: Left Arm)   Pulse 80  Temp 97.6 F (36.4 C) (Oral)   Resp 16   Ht 5\' 3"  (1.6 m)   Wt 68 kg   SpO2 97%   BMI 26.57 kg/m   Physical Exam Vitals and nursing note reviewed.  Constitutional:      General: She is not in acute distress.    Appearance: She is well-developed. She is not diaphoretic.  HENT:     Head: Normocephalic and atraumatic.  Eyes:     Conjunctiva/sclera: Conjunctivae normal.  Cardiovascular:     Rate and Rhythm: Normal rate and regular rhythm.  Pulmonary:     Effort: Pulmonary effort is normal.  Musculoskeletal:       Cervical back: Neck supple.  Skin:    General: Skin is warm and dry.     Coloration: Skin is not pale.  Neurological:     Mental Status: She is alert.  Psychiatric:        Mood and Affect: Mood normal.     Comments: Patient makes occasional eye contact during the interview, however, she is rather inattentive, doing something on her phone for most of the interview.     ED Results / Procedures / Treatments   Labs (all labs ordered are listed, but only abnormal results are displayed) Labs Reviewed - No data to display  EKG None  Radiology No results found.  Procedures Procedures (including critical care time)  Medications Ordered in ED Medications - No data to display  ED Course  I have reviewed the triage vital signs and the nursing notes.  Pertinent labs & imaging results that were available during my care of the patient were reviewed by me and considered in my medical decision making (see chart for details).  Clinical Course as of Feb 05 1838  Mon Feb 06, 2019  1832 Spoke with patient's mother, Jobe Igo, listed as patient's contact since she has a notification stating she needs to have a legal guardian notified. We discussed the patient's presentation and the steps she would need to take to pursue rehab. Patient's mother states she is on board with this plan.   [SJ]    Clinical Course User Index [SJ] Kerby Hockley, Maceo Pro   MDM Rules/Calculators/A&P                      Patient presents requesting substance abuse rehabilitation.  She was seen and cleared by psych this morning.  She states nothing has changed from this morning.  Denies any thoughts of self-harm. Peers support consult placed. Prior to discharge patient states, "Someone told me if I say I am having suicidal thoughts then I can stay overnight."  When I asked the patient if she was having thoughts of self-harm or suicidal thoughts, she states, "No, I have a lot to live for.  I am trying to get my  kid back."    Findings and plan of care discussed with Ronnald Ramp, MD.   Final Clinical Impression(s) / ED Diagnoses Final diagnoses:  Heroin abuse [facility])    Rx / [place] Orders ED Discharge Orders    None       Layla Maw 02/06/19 1840    Layla Maw 02/06/19 2049    Hayden Rasmussen, MD 02/07/19 [phone]

## 2019-02-06 NOTE — H&P (Signed)
Behavioral Health Medical Screening Exam  Anne Graham is an 29 y.o. female.who presents to [facility] asa a walk-in, voluntarily. Patient presented to [facility] earlier however, she left before an assessment by a provider could take place. She returned requesting detox from heroin. She reports she has been addicted to heroin for many years. Per chart review, patient discussed with TTS counselor when she was here earlier her consequences behind her heroin addition included her child being removed from her care and a number of legal charges with one court date scheduled for today. She is irritable at this time and reports her last use of heroin was a few days ago.  She denies SI, HI or AVH. Reports she is open to substance abuse detox and treatment programs.   Total Time spent with patient: 15 minutes  Psychiatric Specialty Exam: Physical Exam  Vitals reviewed. Constitutional: She is oriented to person, place, and time.  Neurological: She is alert and oriented to person, place, and time.    Review of Systems  Psychiatric/Behavioral: Negative for agitation, behavioral problems, confusion, decreased concentration, dysphoric mood, hallucinations, self-injury, sleep disturbance and suicidal ideas. The patient is not nervous/anxious and is not hyperactive.     There were no vitals taken for this visit.There is no height or weight on file to calculate BMI.  General Appearance: Guarded  Eye Contact:  Minimal  Speech:  Clear and Coherent and Normal Rate  Volume:  Decreased  Mood:  Irritable  Affect:  Constricted  Thought Process:  Coherent, Linear and Descriptions of Associations: Intact  Orientation:  Full (Time, Place, and Person)  Thought Content:  Logical  Suicidal Thoughts:  No  Homicidal Thoughts:  No  Memory:  Immediate;   Fair Recent;   Fair  Judgement:  Impaired  Insight:  Shallow  Psychomotor Activity:  Normal  Concentration: Concentration: Fair and Attention Span: Fair  Recall:  [employer] of  Knowledge:Fair  Language: Good  Akathisia:  Negative  Handed:  Right  AIMS (if indicated):     Assets:  Communication Skills Desire for Improvement  Sleep:       Musculoskeletal: Strength & Muscle Tone: within normal limits Gait & Station: normal Patient leans: N/A  There were no vitals taken for this visit.  Recommendations:  Based on my evaluation the patient does not appear to have an emergency medical condition.  No evidence of imminent risk to self or others at present.   Patient does not meet criteria for psychiatric inpatient admission. Patient provided with a number of substance abuse treatment programs. It was highly recommended that she follow-up with the resources provided for addiction services.       Denzil Magnuson, NP 02/06/2019, 11:35 AM

## 2019-02-06 NOTE — BH Assessment (Signed)
[facility] Assessment Progress Note  Patient was seen earlier this date and did not meet inpatient criteria at that time denying any S/I, H/I or AVH.  Patient was requesting a rehab program for ongoing SA issues. See Epic note. Patient was given referrals at that time and discharged. Patient presents back stating "none of those resources worked" and continues to request a rehab program. Patient denies any S/I, H/I or AVH. Case was staffed with [place] NP and Joy PA who recommended patient be discharged and follow up with OP resources.

## 2019-02-06 NOTE — BH Assessment (Addendum)
Assessment Note  Anne Graham is an 29 y.o. female that presents this date voluntary requesting assistance with SA issues. Patient denies any S/I, H/I or AVH. Patient denies any prior mental health diagnosis or current symptoms. Patient states she is here "just to try and get her child back." Patient states she feels if she gets assistance with current SA issues she may be eligible to gain back custody of her child that is currently residing with a family member since April of 2020. 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 Patient denies any other SA use. Patient denies any self injurious behaviors. Patient denies currently having a OP provider although states she has tried seeking help from [facility] and [place] although they "didn't have the right program for her." Patient is seeking a residential treatment facility to "get her away from everything." Patient states she has been in [place] for the last three days "staying with friends" and is from [facility]) where she is on unsupervised probation and currently has multiple upcoming charges and court dates for assault, trespassing, larceny and possession. Patient denies any prior mental health inpatient admissions and states this date "I don't have any of that crazy stuff going on" as this writer attempts to assess. Patient is observed to be partially impaired at the time of assessment and is very circumstantial although is redirectable. Patient contracts for safety and denies any thoughts of self harm.   Patient is dressed casually  and oriented x4. Patient speaks in normal tone and volume. Eye contact isfair. Patient's mood is irritable and frustrated;affect is congruent with mood. Thought  process is circumstantial but redirectable. There is no indication patient is currently responding to internal stimuli. This Clinical research associate informed patient of multiple programs in the area to include [facility] at [facility] and having peer support contact her to research possible residential programs. Case was staffed with Maisie Fus NP who also evaluated patient and recommended patient follow up with OP resources provided.    Diagnosis: Substance induced mood disorder  Past Medical History:  Past Medical History:  Diagnosis Date  . Lumbar vertebral fracture [facility])     Past Surgical History:  Procedure Laterality Date  . CHOLECYSTECTOMY      Family History:  Family History  Problem Relation Age of Onset  . Diabetes Father     Social History:  reports that she has been smoking. She has been smoking about 1.00 pack per day. She has never used smokeless tobacco. She reports that she does not drink alcohol or use drugs.  Additional Social History:  Alcohol / Drug Use Pain Medications: See MAR Prescriptions: See MAR Over the Counter: See MAR History of alcohol / drug use?: Yes Longest period of sobriety (when/how long): Unknown Negative Consequences of Use: (Denies) Withdrawal Symptoms: (Denies) Substance #1 Name of Substance 1: Heroin 1 - Age of First Use: 17 1 - Amount (size/oz): Varies 1 - Frequency: Varies 1 - Duration: Ongoing 1 - Last Use / Amount: 02/05/19 1/2 gram  CIWA:   COWS:    Allergies:  Allergies  Allergen Reactions  . Toradol [Ketorolac Tromethamine]     Difficulty swallowing and feels metal test in her mouth for long times.    Home Medications: (  Not in a hospital admission)   OB/GYN Status:  No LMP recorded.  General Assessment Data Location of Assessment: [facility] Assessment Services TTS Assessment: In system Is this a Tele or Face-to-Face Assessment?: Face-to-Face Is this an Initial Assessment or a Re-assessment for this encounter?: Initial Assessment Patient Accompanied  by:: N/A Language Other than English: No Living Arrangements: Other (Comment) What gender do you identify as?: Female Marital status: Single Pregnancy Status: No Living Arrangements: Alone Can pt return to current living arrangement?: Yes Admission Status: Voluntary Is patient capable of signing voluntary admission?: Yes Referral Source: Self/Family/Friend Insurance type: Medicaid     Crisis Care Plan Living Arrangements: Alone Legal Guardian: (NA) Name of Psychiatrist: None Name of Therapist: None  Education Status Is patient currently in school?: No Is the patient employed, unemployed or receiving disability?: Unemployed  Risk to self with the past 6 months Suicidal Ideation: No Has patient been a risk to self within the past 6 months prior to admission? : No Suicidal Intent: No Has patient had any suicidal intent within the past 6 months prior to admission? : No Is patient at risk for suicide?: No, but patient needs Medical Clearance Suicidal Plan?: No Has patient had any suicidal plan within the past 6 months prior to admission? : No Access to Means: No What has been your use of drugs/alcohol within the last 12 months?: NA Previous Attempts/Gestures: No How many times?: 0 Other Self Harm Risks: (Excessive SA use) Triggers for Past Attempts: (NA) Intentional Self Injurious Behavior: None Family Suicide History: No Recent stressful life event(s): Other (Comment)(Excessive SA use) Persecutory voices/beliefs?: No Depression: No Depression Symptoms: (Denies) Substance abuse history and/or treatment for substance abuse?: No Suicide prevention information given to non-admitted patients: Not applicable  Risk to Others within the past 6 months Homicidal Ideation: No Does patient have any lifetime risk of violence toward others beyond the six months prior to admission? : No Thoughts of Harm to Others: No Current Homicidal Intent: No Current Homicidal Plan: No Access to  Homicidal Means: No Identified Victim: NA History of harm to others?: No Assessment of Violence: None Noted Violent Behavior Description: NA Does patient have access to weapons?: No Criminal Charges Pending?: Yes Describe Pending Criminal Charges: Larceny, Trespassing, assault Does patient have a court date: Yes Court Date: (Multiple) Is patient on probation?: No  Psychosis Hallucinations: None noted Delusions: None noted  Mental Status Report Appearance/Hygiene: Unremarkable Eye Contact: Fair Motor Activity: Freedom of movement Speech: Logical/coherent Level of Consciousness: Alert Mood: Irritable, Preoccupied Affect: Appropriate to circumstance Anxiety Level: Minimal Thought Processes: Circumstantial Judgement: Partial Orientation: Person, Place, Time Obsessive Compulsive Thoughts/Behaviors: None  Cognitive Functioning Concentration: Decreased Memory: Recent Intact, Remote Intact Is patient IDD: No Insight: Fair Impulse Control: Poor Appetite: Good Have you had any weight changes? : No Change Sleep: No Change Total Hours of Sleep: 5 Vegetative Symptoms: None  ADLScreening [facility] Assessment Services) Patient's cognitive ability adequate to safely complete daily activities?: Yes Patient able to express need for assistance with ADLs?: Yes Independently performs ADLs?: Yes (appropriate for developmental age)  Prior Inpatient Therapy Prior Inpatient Therapy: No  Prior Outpatient Therapy Prior Outpatient Therapy: No Does patient have an ACCT team?: No Does patient have Intensive In-House Services?  : No Does patient have [facility] services? : No Does patient have P4CC services?: No  ADL Screening (condition at time of admission) Patient's cognitive ability adequate to safely complete daily activities?: Yes Is the patient deaf or have difficulty hearing?: No Does  the patient have difficulty seeing, even when wearing glasses/contacts?: No Does the patient have  difficulty concentrating, remembering, or making decisions?: No Patient able to express need for assistance with ADLs?: Yes Does the patient have difficulty dressing or bathing?: No Independently performs ADLs?: Yes (appropriate for developmental age) Does the patient have difficulty walking or climbing stairs?: No Weakness of Legs: None Weakness of Arms/Hands: None  Home Assistive Devices/Equipment Home Assistive Devices/Equipment: None  Therapy Consults (therapy consults require a physician order) PT Evaluation Needed: No OT Evalulation Needed: No SLP Evaluation Needed: No Abuse/Neglect Assessment (Assessment to be complete while patient is alone) Abuse/Neglect Assessment Can Be Completed: Yes Physical Abuse: Denies Verbal Abuse: Denies Sexual Abuse: Denies Exploitation of patient/patient's resources: Denies Self-Neglect: Denies Values / Beliefs Cultural Requests During Hospitalization: None Spiritual Requests During Hospitalization: None Consults Spiritual Care Consult Needed: No Transition of Care Team Consult Needed: No Advance Directives (For Healthcare) Does Patient Have a Medical Advance Directive?: No Would patient like information on creating a medical advance directive?: No - Patient declined          Disposition:  Disposition Initial Assessment Completed for this Encounter: Yes Disposition of Patient: Discharge  On Site Evaluation by:   Reviewed with Physician:    Mamie Nick 02/06/2019 8:39 AM

## 2019-02-06 NOTE — Discharge Instructions (Signed)
Please refer to the attached resources.  Typically, you will need to be assessed through an outpatient program first before any other program.  This has to be an action taken by you.  No one can do it for you.

## 2019-07-14 IMAGING — DX DG CHEST 1V PORT
1 series · 1 of 1 positions shown · non-contrast
Comparison: Chest radiograph from one day prior.

CLINICAL DATA: Acute respiratory failure with hypoxia

EXAM:
PORTABLE CHEST 1 VIEW

[chest]
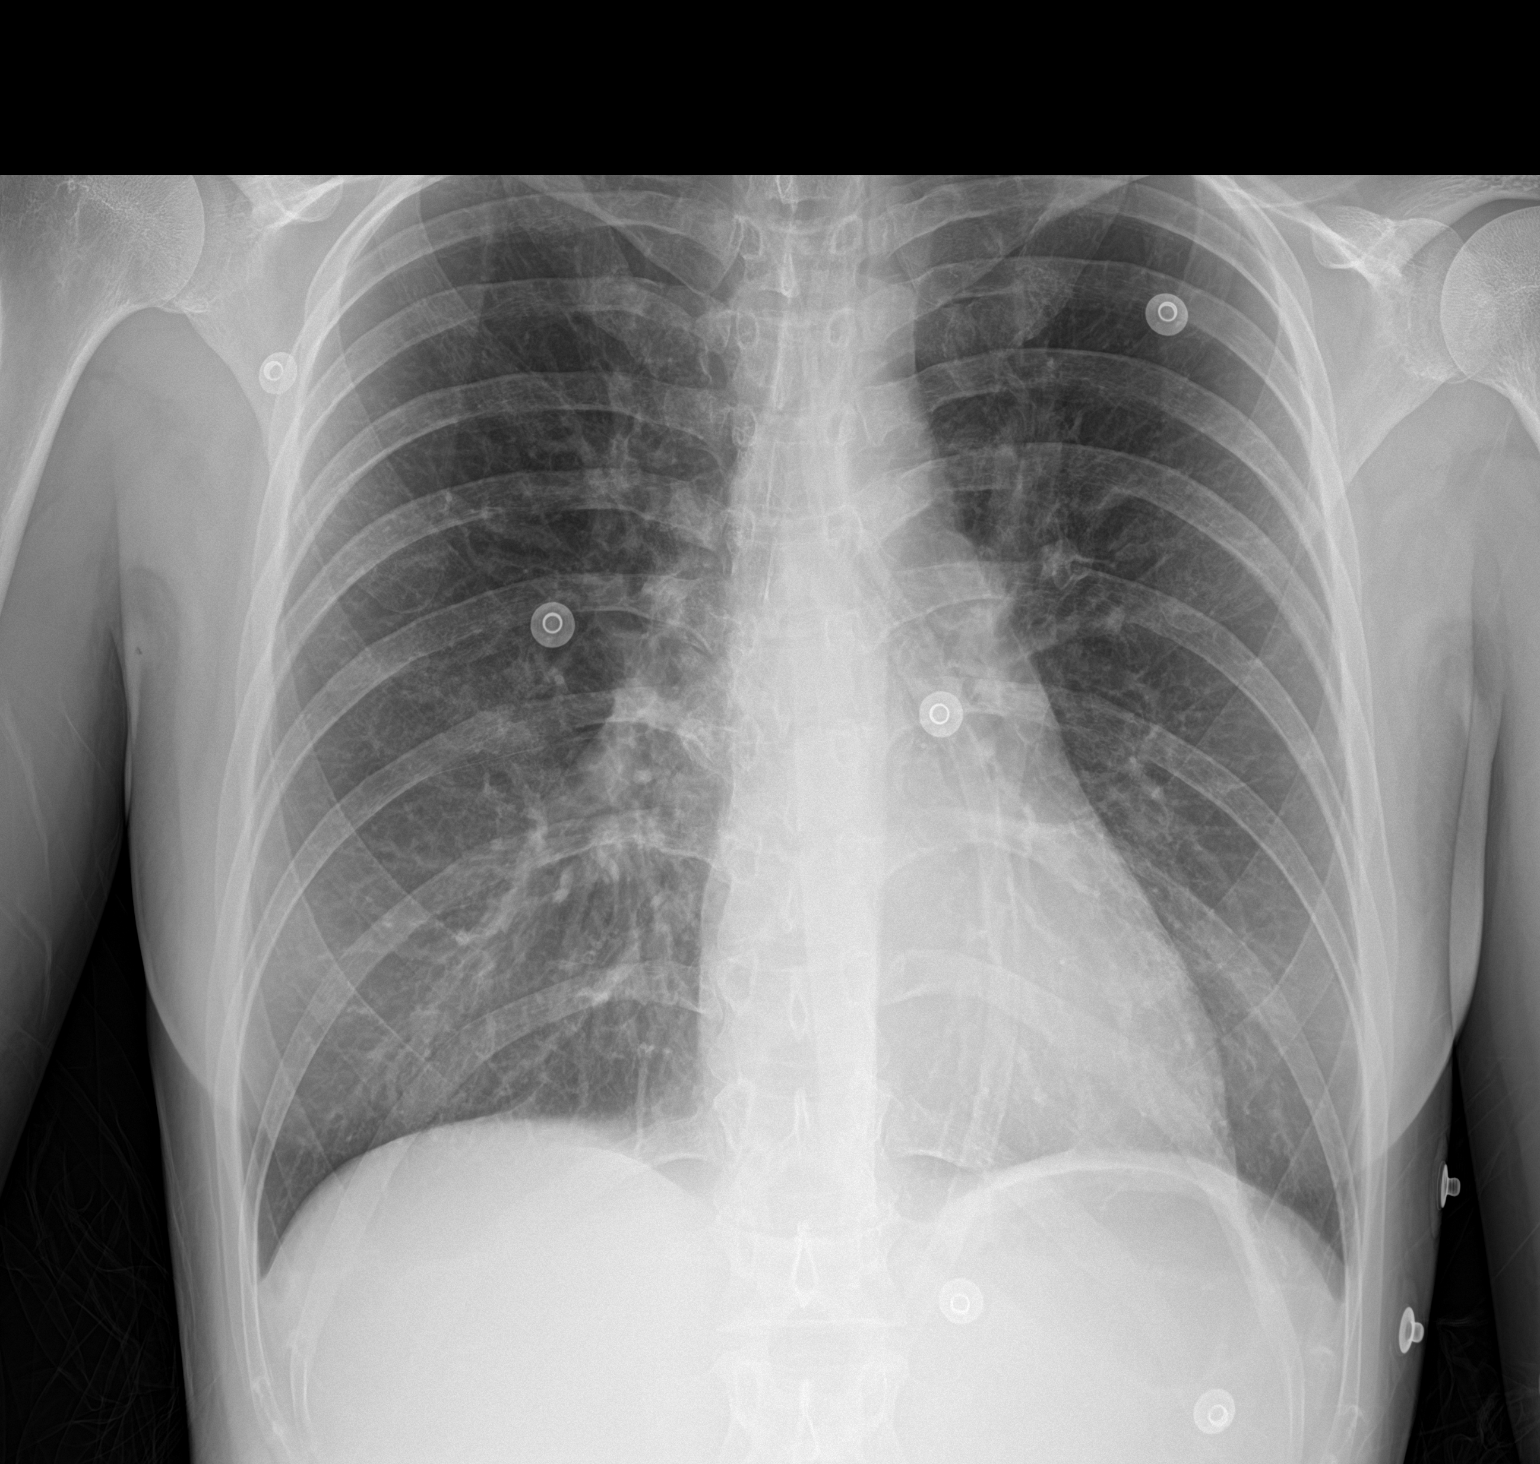

[1 of 1 positions shown; findings below may reference images not displayed]

FINDINGS: Stable cardiomediastinal silhouette with normal heart size. No
pneumothorax. No pleural effusion. Lungs appear clear, with no acute
consolidative airspace disease and no pulmonary edema.
IMPRESSION: No active disease.
